# Patient Record
Sex: Female | Born: 1960
Health system: Southern US, Community
[De-identification: ages and names within clinical notes are randomized; demographics above are authoritative.]

## PROBLEM LIST (undated history)

## (undated) DIAGNOSIS — Z789 Other specified health status: Secondary | ICD-10-CM

## (undated) HISTORY — DX: Other specified health status: Z78.9

## (undated) HISTORY — PX: WISDOM TOOTH EXTRACTION: SHX21

---

## 2017-02-24 ENCOUNTER — Encounter: Payer: Self-pay | Admitting: Sports Medicine

## 2017-02-24 ENCOUNTER — Ambulatory Visit (INDEPENDENT_AMBULATORY_CARE_PROVIDER_SITE_OTHER): Payer: 59 | Admitting: Sports Medicine

## 2017-02-24 ENCOUNTER — Ambulatory Visit: Payer: Self-pay

## 2017-02-24 VITALS — BP 125/82 | HR 74 | Ht 65.0 in | Wt 133.0 lb

## 2017-02-24 DIAGNOSIS — M25562 Pain in left knee: Secondary | ICD-10-CM | POA: Insufficient documentation

## 2017-02-24 HISTORY — DX: Pain in left knee: M25.562

## 2017-02-24 NOTE — Progress Notes (Signed)
  HESPER VENTURELLA - 56 y.o. female MRN [id number]  Date of birth: 1961-09-16  SUBJECTIVE:  Including CC & ROS.   Ms. Kittler is a 56 yo F that is presenting with left knee pain. The pain has been occurring since January when she is working out in her yard. It started out as a soreness and did not go away. She has taken Advil a day and that seems to have significantly improved her pain. She has pain with certain movements and flexion and extension. She has worn a compression brace and that has helped. She denies any giving way. She has not had any formal physical therapy or prior x-rays. She denies any prior injury before this or any swelling.  HISTORY: Past Medical, Surgical, Social, and Family History Reviewed & Updated per EMR.   Pertinent Historical Findings include: PMSHx -  None  PSHx -  No tobacco or alcohol use  FHx -  Cancer   DATA REVIEWED: None   PHYSICAL EXAM:  VS: BP:125/82  HR:74bpm  TEMP: ( )  RESP:   HT:5\' 5"  (165.1 cm)   WT:133 lb (60.3 kg)  BMI:22.2 PHYSICAL EXAM: Gen: NAD, alert, cooperative with exam, well-appearing HEENT: clear conjunctiva, EOMI CV:  no edema, capillary refill brisk,  Resp: non-labored, normal speech Skin: no rashes, normal turgor  Neuro: no gross deficits.  Psych:  alert and oriented Knee: Normal to inspection with no erythema or effusion or obvious bony abnormalities. Palpation normal with no warmth,patellar tenderness, or condyle tenderness. Some medial joint line tenderness. Has some slight catching when moved from flexion to extension ROM full in flexion and extension and lower leg rotation. Ligaments with solid consistent endpoints including  LCL, MCL. Some pain with Thessalonian tests. Non painful patellar compression. Patellar glide without crepitus. Patellar and quadriceps tendons unremarkable. Hamstring and quadriceps strength is normal.    Limited ultrasound: Left knee:  Mild effusion observed in the SPP  QT and PT were  viewed in long axis and found to be normal.  The lateral meniscus had some outpouching to suggest degenerative changes.  The medial meniscus had some outpouching to suggest degenerative changes.  The trochler groove showed some spurring in the medial and lateral femoral condyle.   Summary: Findings are consistent with degenerative meniscal changes and a mild effusion.  Ultrasound and interpretation by Clare Gandy, MD   ASSESSMENT & PLAN:   Acute pain of left knee She likely has symptoms associated with contusion to her meniscus and evidence of degenerative changes on ultrasound. - Continue compression sleeve - Provided home exercises - Follow-up in 2 months

## 2017-02-24 NOTE — Patient Instructions (Signed)
Thank you for coming in,   Mini squat with both legs to about 30 degrees   Mini lunge. Take a shorter step if it hurts.   Try one leg wall slides.   One leg mini squats.     Please feel free to call with any questions or concerns at any time, at [phone]. --Dr. Jordan Likes

## 2017-02-24 NOTE — Assessment & Plan Note (Signed)
She likely has symptoms associated with contusion to her meniscus and evidence of degenerative changes on ultrasound. - Continue compression sleeve - Provided home exercises - Follow-up in 2 months

## 2017-03-03 ENCOUNTER — Encounter: Payer: Self-pay | Admitting: *Deleted

## 2017-03-03 ENCOUNTER — Telehealth: Payer: Self-pay | Admitting: *Deleted

## 2017-03-03 DIAGNOSIS — M25562 Pain in left knee: Secondary | ICD-10-CM

## 2017-03-03 NOTE — Telephone Encounter (Signed)
Patient states she can only get and MRI (if that is our next step) at [facility] due to her insurance.  Per Fields, the plan for now is to get images and based on those we will probably move toward injections before the MRI option

## 2017-03-05 ENCOUNTER — Ambulatory Visit: Payer: Self-pay | Admitting: Sports Medicine

## 2017-03-12 ENCOUNTER — Other Ambulatory Visit: Payer: Self-pay | Admitting: Orthopedic Surgery

## 2017-03-12 DIAGNOSIS — M25562 Pain in left knee: Secondary | ICD-10-CM

## 2017-03-12 DIAGNOSIS — R609 Edema, unspecified: Secondary | ICD-10-CM

## 2017-03-13 ENCOUNTER — Ambulatory Visit
Admission: RE | Admit: 2017-03-13 | Discharge: 2017-03-13 | Disposition: A | Discharge status: Home / Self Care | Payer: 59 | Source: Ambulatory Visit | Attending: Orthopedic Surgery | Admitting: Orthopedic Surgery

## 2017-03-13 DIAGNOSIS — M25562 Pain in left knee: Secondary | ICD-10-CM

## 2017-03-13 DIAGNOSIS — R609 Edema, unspecified: Secondary | ICD-10-CM

## 2017-03-19 ENCOUNTER — Encounter: Payer: Self-pay | Admitting: Sports Medicine

## 2017-03-20 ENCOUNTER — Encounter: Payer: Self-pay | Admitting: Sports Medicine

## 2017-03-23 ENCOUNTER — Telehealth: Payer: Self-pay | Admitting: *Deleted

## 2017-03-23 DIAGNOSIS — M25562 Pain in left knee: Secondary | ICD-10-CM

## 2017-03-23 NOTE — Telephone Encounter (Signed)
Order faxed to [facility]. °

## 2017-03-30 ENCOUNTER — Other Ambulatory Visit: Payer: Self-pay | Admitting: *Deleted

## 2017-03-30 MED ORDER — CARISOPRODOL 350 MG PO TABS: 350.0000 mg | ORAL_TABLET | Freq: Three times a day (TID) | ORAL | 0 refills | Status: DC

## 2017-03-31 ENCOUNTER — Encounter: Payer: Self-pay | Admitting: Sports Medicine

## 2017-03-31 ENCOUNTER — Ambulatory Visit (INDEPENDENT_AMBULATORY_CARE_PROVIDER_SITE_OTHER): Payer: 59 | Admitting: Sports Medicine

## 2017-03-31 DIAGNOSIS — M25562 Pain in left knee: Secondary | ICD-10-CM | POA: Diagnosis not present

## 2017-03-31 NOTE — Progress Notes (Signed)
F/U Left knee pain  Knee has been locking since prior Appt.  At that appt she showed hypoechoic change of meniscus and some degenerative meniscal change.  Now hurts along anterior knee  Saw Dr Sherlean FootLucey and had MRI Some meniscal tearing and DJD changes/ small Baker's cyst Also PF cartilage thinning He offered surgical approach but she decided to try PT  After couple days of PT, knee seemed to lock in slt flexion. She also gets a burning pain down to lateral calf Position change will relieve this Difficulty walking when locked  Today she felt pop anteriorly and knee unlocked  Soc Hx - works for chiropractor Having problems working as the knee has been painful  ROS No swelling No warmth or redness  PExam WF in NAD Ht 5' 5.5" (1.664 m)   Wt 133 lb (60.3 kg)   BMI 21.80 kg/m   Left Knee Knee: Normal to inspection with no erythema or effusion or obvious bony abnormalities. Palpation normal with no warmth or joint line tenderness  Some patellar tenderness. ROM normal in flexion and extension and lower leg rotation. Ligaments with solid consistent endpoints including ACL, PCL, LCL, MCL. Negative Mcmurray's and provocative meniscal tests. Tracking laterally with clicking of patella on repeat flexio  painful patellar compression. Patellar and quadriceps tendons unremarkable. Hamstring and quadriceps strength is normal. Baker's cyst is not palpable  MRI reviewed Some signal in meniscus Small  Post medial meniscus tear Small Baker's cyst Some medial condyle bone edema and cartilage loss Thinning of lateral patellar cartilage

## 2017-03-31 NOTE — Assessment & Plan Note (Signed)
I think she had patellar subluxation leaving the knee in flexion Now back in track Some degenerative changes noted on MRI  HEP and progress to biking Patellar subluxation brace given Icing  REck in 4 wees

## 2017-03-31 NOTE — Patient Instructions (Signed)
I think your knee cap is coming out of position and gets locked  Do the 3 exercises on the sheet 10 isometric # sets of 15 of the other two Add ankle weight when you can  Use brace when standing and walking You can take this off periodically Don't sleep in brace  Ice at end of day  I think the nerve irritation will gradually settle down Use ibuprofen to help control

## 2017-04-02 ENCOUNTER — Ambulatory Visit: Payer: 59 | Admitting: Sports Medicine

## 2017-05-05 ENCOUNTER — Ambulatory Visit: Payer: 59 | Admitting: Sports Medicine

## 2017-05-19 ENCOUNTER — Ambulatory Visit: Payer: 59 | Admitting: Sports Medicine

## 2017-06-09 ENCOUNTER — Ambulatory Visit: Payer: 59 | Admitting: Sports Medicine

## 2019-02-02 IMAGING — MR MR KNEE*L* W/O CM
5 of 6 series · 32 of 40 positions shown · non-contrast
Comparison: None.

CLINICAL DATA: Acute left knee pain and swelling especially
posteriorly.

EXAM:
MRI OF THE LEFT KNEE WITHOUT CONTRAST
TECHNIQUE: Multiplanar, multisequence MR imaging of the knee was performed. No
intravenous contrast was administered.

[Series 6: PD fat-sat · axial · left · 3.0mm · 0.39mm/px · z∈[-72,+43]mm · 8 of 36 slices shown (1 of 3)]
[im 1/36]
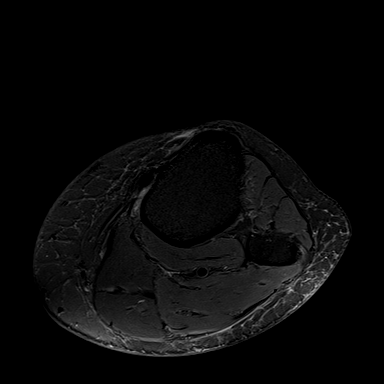
[im 6/36]
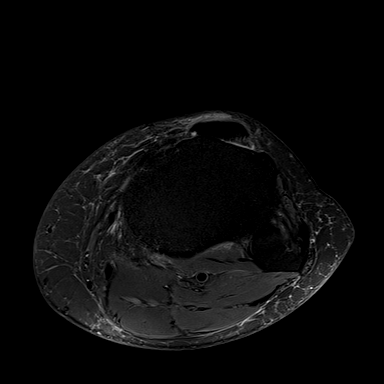
[im 11/36]
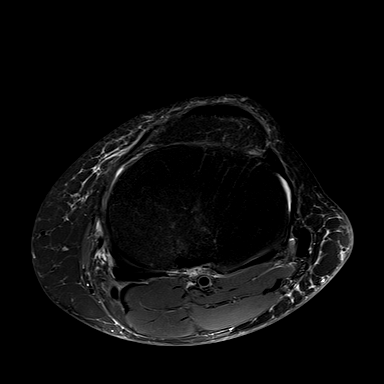
[im 16/36]
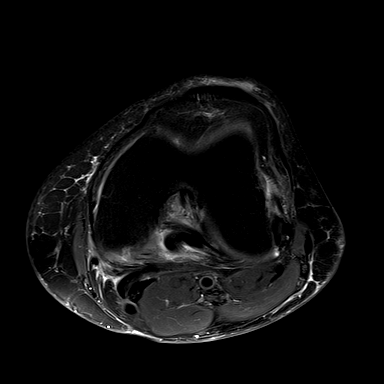
[im 21/36]
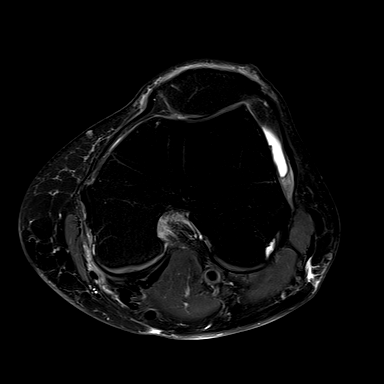
[im 26/36]
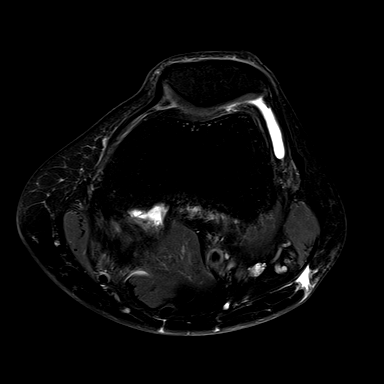
[im 31/36]
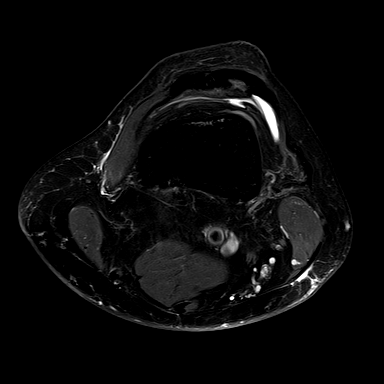
[im 36/36]
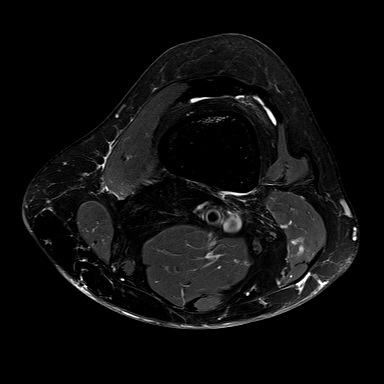

[Series 8: PD fat-sat · sagittal · left · 3.0mm · 0.39mm/px · 6 of 27 slices shown (2 of 3)]
[im 1/27]
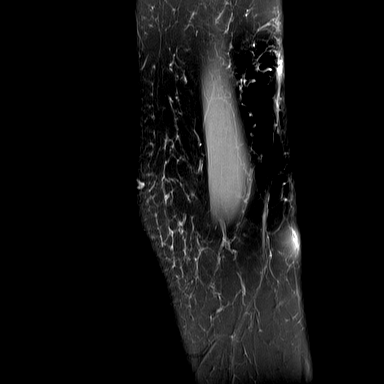
[im 6/27]
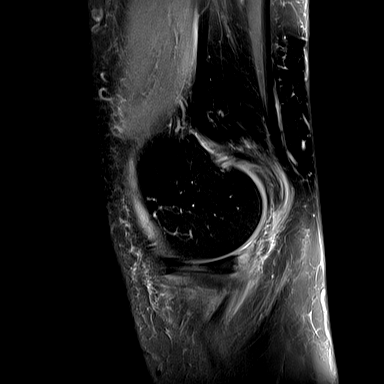
[im 11/27]
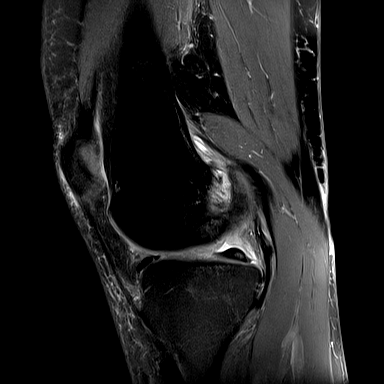
[im 16/27]
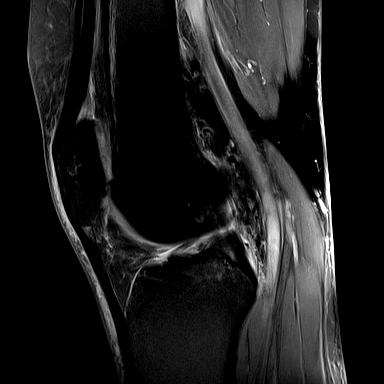
[im 21/27]
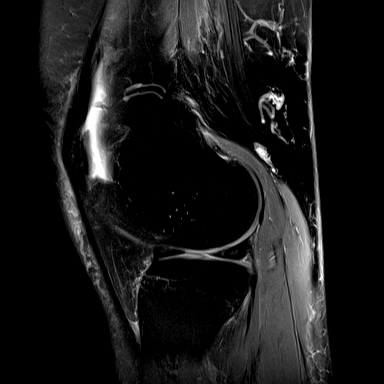
[im 27/27]
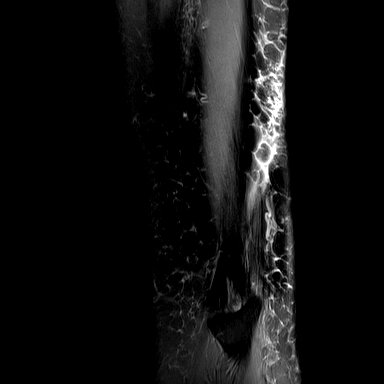

[Series 9: PD fat-sat · coronal · left · 3.0mm · 0.33mm/px · 7 of 33 slices shown (3 of 3)]
[im 1/33]
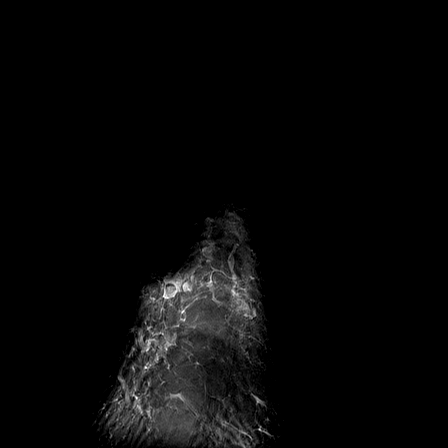
[im 6/33]
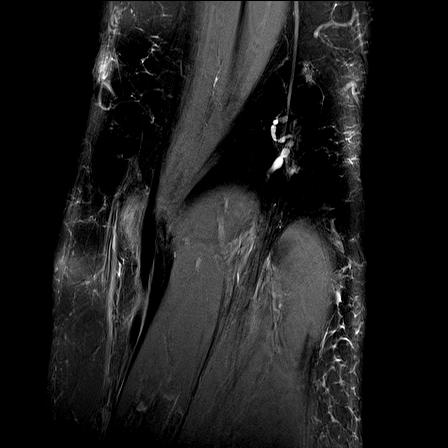
[im 11/33]
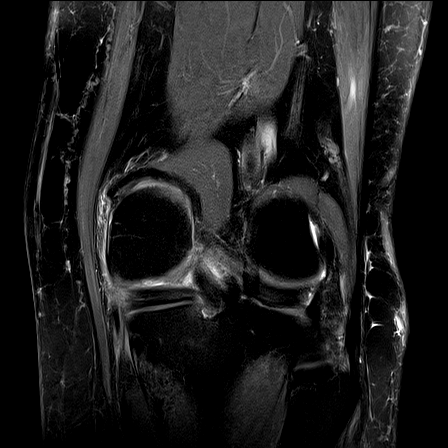
[im 17/33]
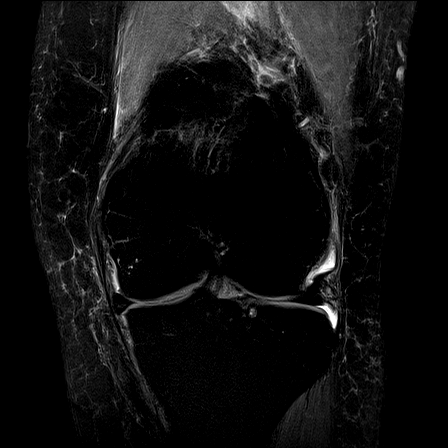
[im 22/33]
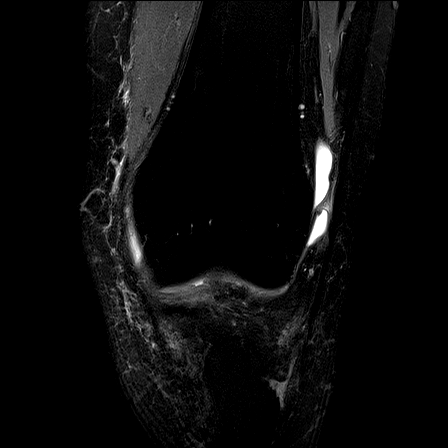
[im 27/33]
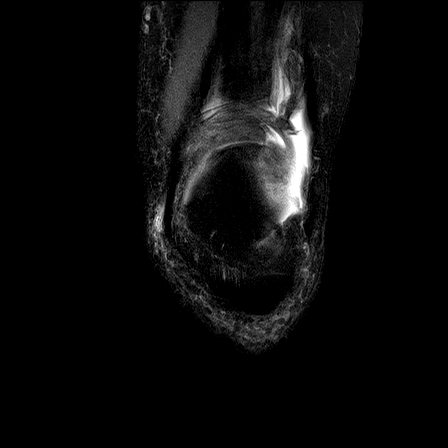
[im 33/33]
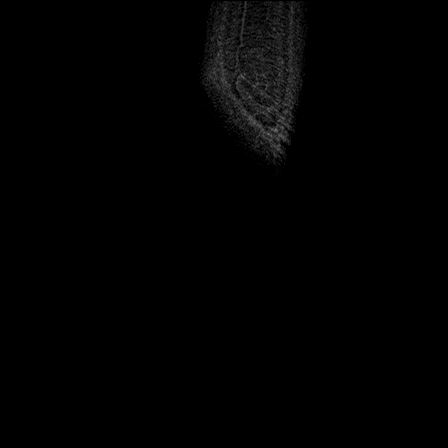

[Series 10: T2 fat-sat · coronal · left · 3.0mm · 0.39mm/px · 6 of 33 slices shown]
[im 1/33]
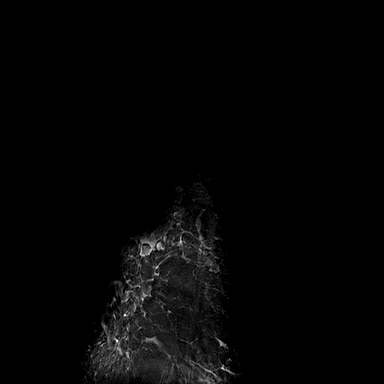
[im 6/33]
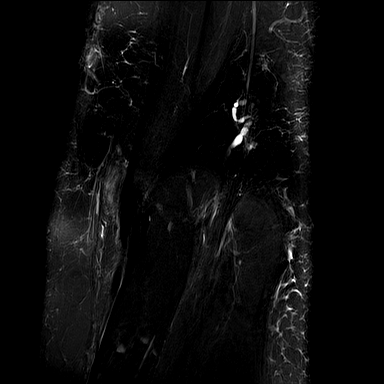
[im 11/33]
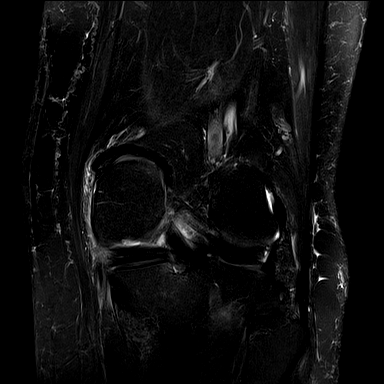
[im 17/33]
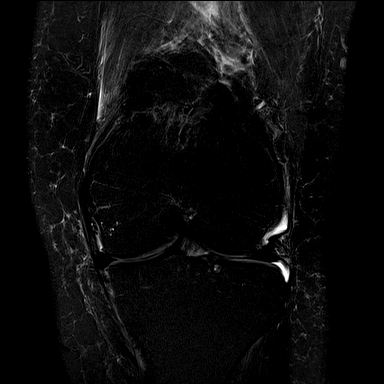
[im 22/33]
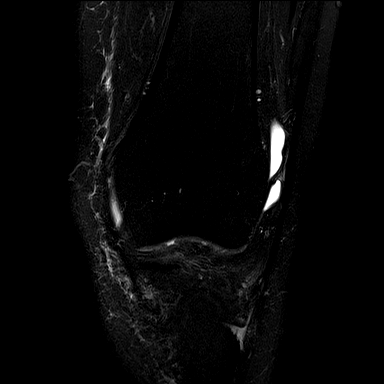
[im 27/33]
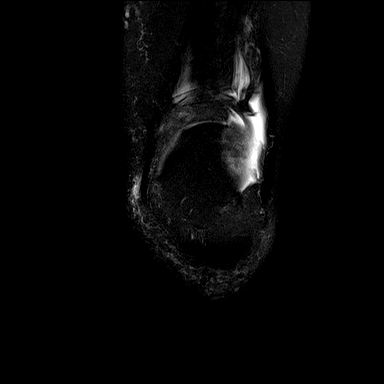

[Series 11: PD · coronal · left · 1.5mm · 0.44mm/px · 5 of 21 slices shown]
[im 1/21]
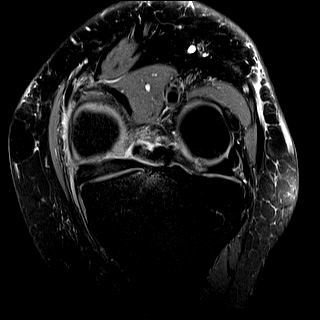
[im 6/21]
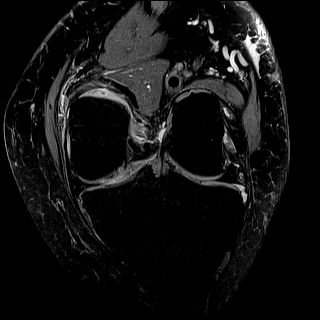
[im 11/21]
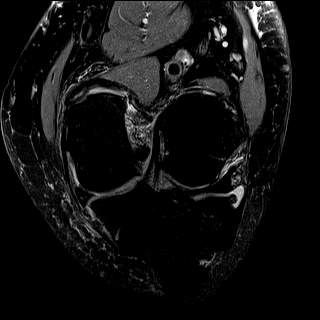
[im 16/21]
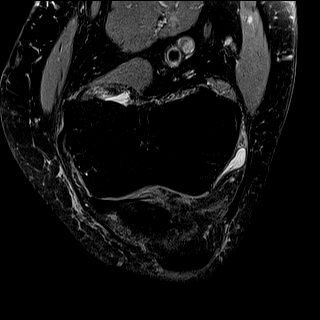
[im 21/21]
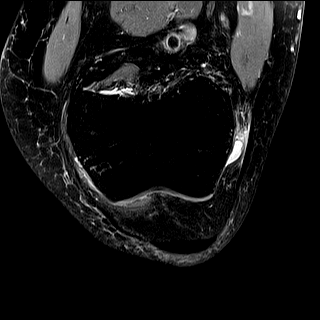

[32 of 40 positions shown; findings below may reference images not displayed]

FINDINGS: MENISCI

Medial meniscus: Grade 1 signal in the posterior horn without
surface extension.

Lateral meniscus: Small free edge tear of the posterior horn on
image [DATE] also shown on image [DATE] and image [DATE]. Grade 1 signal
in the anterior and posterior horn is also present.

LIGAMENTS

Cruciates:  Unremarkable

Collaterals: Mild edema tracks adjacent to the MCL. This can be
incidental but in the appropriate clinical circumstance could
represent grade 1 sprain.

CARTILAGE

Patellofemoral: Mild chondral thinning along the femoral trochlear
groove and portions of the lateral patellar facet.

Medial: Focal chondral defect posteriorly along the medial femoral
condyle approximately 1.0 by 0.9 cm shown on images 10 through 12
series 9, with associated mild chondral surface irregularity in the
medial femoral condyle and underlying mild to moderate degenerative
chondral thinning. Mild marginal spurring.

Lateral: Mild to moderate degenerative chondral thinning with mild
focal sagittally oriented chondral irregularity along the lateral
femoral condyle posteriorly. Mild marginal spurring.

Joint:  Small knee effusion.  Mildly thickened medial plica.

Popliteal Fossa: Small Baker's cyst. Mild semimembranosus - tibial
collateral ligament bursitis and mild pes anserine bursitis.

Extensor Mechanism: Tendinopathy or mild focal partial tearing of
the distal quadriceps tendon laterally, image [DATE].

Bones: No significant extra-articular osseous abnormalities
identified.

Other: No supplemental non-categorized findings.
IMPRESSION: 1. Small free edge tear of the posterior horn lateral meniscus.
2. Tendinopathy or mild focal partial tearing of the distal lateral
quadriceps tendon.
3. Osteoarthritis, with focal chondral defects along the femoral
condyles as detailed above.
4. Mild edema tracks adjacent to the MCL. This can be incidental but
in the appropriate clinical circumstance could represent grade 1
sprain.
5. Posteromedial edema along the knee with small Baker' s cyst, mild
semimembranosus - tibial collateral ligament bursitis, and mild pes
anserine bursitis.
6. Small knee effusion with mildly thickened medial plica.

## 2021-02-27 NOTE — Progress Notes (Signed)
New Patient Office Visit  Subjective:  Patient ID: Julie Pittman, female    DOB: 05-04-1961  Age: 60 y.o. MRN: [id number]  CC:  Chief Complaint  Patient presents with  . Establish Care  . Gastritis    Discuss sucralfate     HPI Julie Pittman presents to establish care.  She is a very pleasant 60 year old female is establishing care with a PCP after many years without consistent medical care.   Notes an area of tenderness in the epigastric area at the base of the sternum that radiates bilaterally over the bottom of her ribs. Reports having this years ago and completing a workup with a GI provider. She trialed multiple medications for GERD and they did not help. Was finally given a prescription for Carafate which helped tremendously and her pain was gone after about 2 weeks. Gets this pain in times of high stress. Over the past couple of weeks, has had an increase in stress and her pain has returned. She has the remnants of the Carafate that she did not need with her but they expired several years ago. Would like to have a refill of this since it helped so much last time.   Leg swelling- notes that if she wears regular socks, she gets swelling around the band of the socks at the end of the day. Has started wearing compression stockings which seem to be helpful but there are some that feel like they cut her at the back of her knee. Not adding salt and tries to avoid high sodium foods.   Past Medical History:  Diagnosis Date  . Acute pain of left knee 02/24/2017   MRI shows degnerative meniscal changes and some focal OA These do not correlate with curretn pain pattern  . Celiac disease 02/28/2021  . No pertinent past medical history   . Postmenopausal atrophic vaginitis 02/28/2021    Past Surgical History:  Procedure Laterality Date  . WISDOM TOOTH EXTRACTION      Family History  Problem Relation Age of Onset  . Heart attack Father   . Lung cancer Father   . Lung cancer Paternal  Aunt   . Lung cancer Maternal Grandfather   . Heart attack Paternal Grandfather   . Rheumatic fever Paternal Grandfather     Social History   Socioeconomic History  . Marital status: Divorced    Spouse name: Not on file  . Number of children: Not on file  . Years of education: Not on file  . Highest education level: Not on file  Occupational History  . Not on file  Tobacco Use  . Smoking status: Former Smoker    Quit date: 1984    Years since quitting: 38.3  . Smokeless tobacco: Never Used  Vaping Use  . Vaping Use: Never used  Substance and Sexual Activity  . Alcohol use: Yes    Comment: occasionally  . Drug use: Never  . Sexual activity: Not Currently    Partners: Male    Birth control/protection: Post-menopausal  Other Topics Concern  . Not on file  Social History Narrative  . Not on file   Social Determinants of Health   Financial Resource Strain: Not on file  Food Insecurity: Not on file  Transportation Needs: Not on file  Physical Activity: Not on file  Stress: Not on file  Social Connections: Not on file  Intimate Partner Violence: Not on file    ROS Review of Systems  Constitutional: Negative for chills,  fatigue, fever and unexpected weight change.  HENT: Negative for congestion, rhinorrhea, sinus pressure and sore throat.   Respiratory: Negative for cough, chest tightness and shortness of breath.   Cardiovascular: Negative for chest pain, palpitations and leg swelling.  Gastrointestinal: Negative for abdominal pain, constipation, diarrhea, nausea and vomiting.  Endocrine: Negative for cold intolerance and heat intolerance.  Genitourinary: Negative for dysuria, frequency, urgency, vaginal bleeding and vaginal discharge.  Skin: Negative for rash and wound.  Neurological: Negative for dizziness, light-headedness and headaches.  Hematological: Does not bruise/bleed easily.  Psychiatric/Behavioral: Negative for self-injury, sleep disturbance and suicidal  ideas. The patient is not nervous/anxious.     Objective:   Today's Vitals: BP 119/78   Pulse 62   Temp 98 F (36.7 C)   Ht 5' 4.5" (1.638 m)   Wt 125 lb 6.4 oz (56.9 kg)   SpO2 100%   BMI 21.19 kg/m   Physical Exam Vitals reviewed.  Constitutional:      General: She is not in acute distress.    Appearance: Normal appearance.  HENT:     Head: Normocephalic and atraumatic.  Eyes:     Extraocular Movements: Extraocular movements intact.     Conjunctiva/sclera: Conjunctivae normal.     Pupils: Pupils are equal, round, and reactive to light.  Cardiovascular:     Rate and Rhythm: Normal rate and regular rhythm.     Pulses: Normal pulses.     Heart sounds: Normal heart sounds. No murmur heard. No friction rub. No gallop.   Pulmonary:     Effort: Pulmonary effort is normal. No respiratory distress.     Breath sounds: Normal breath sounds. No wheezing.  Abdominal:     General: Bowel sounds are normal. There is no distension.     Palpations: Abdomen is soft. There is no mass.     Tenderness: There is no abdominal tenderness. There is no guarding or rebound.     Hernia: No hernia is present.  Musculoskeletal:        General: Normal range of motion.  Skin:    General: Skin is warm and dry.  Neurological:     Mental Status: She is alert and oriented to person, place, and time.  Psychiatric:        Mood and Affect: Mood normal.        Behavior: Behavior normal.        Thought Content: Thought content normal.        Judgment: Judgment normal.     Assessment & Plan:   1. Encounter to establish care Reviewed available information discussed health concerns with patient.  2. Annual physical exam Checking CBC with differential, CMP, and lipid panel today. - CBC with Differential/Platelet - COMPLETE METABOLIC PANEL WITH GFR - Lipid panel  3. Epigastric pain Questionable gastritis versus costochondritis.  Since she does have tenderness in the epigastric area this abdomen  below the sternum, we will go ahead and send in a refill of the Carafate to see if this is helpful.  May benefit from treating with anti-inflammatories for costochondritis if the Carafate does not help.   4. Leg swelling Continue compression stockings/socks. Stay well hydrated and avoid excess sodium in the diet. Suspect this is simply venous insufficiency but no need for aggressive intervention since the swelling is mild and no other symptoms present.   5. Encounter for screening mammogram for malignant neoplasm of breast Mammogram ordered.  - MM DIGITAL SCREENING BILATERAL; Future  6. Screening for endocrine  disorder Checking TSH.  - TSH  7. Screening for colon cancer Since she cannot tolerate the volume of liquid that is required of most colonoscopy preps, she has not had this done. Is open to the procedure if there is a prep that doesn't require large amounts of fluid consumption. Sending a message to Dr. Barron Alvine to see if there are other options that they can use. Will update patient with his reply.  Outpatient Encounter Medications as of 02/28/2021  Medication Sig  . B Complex-C (B-COMPLEX WITH VITAMIN C) tablet Take 1 tablet by mouth daily as needed.  . Magnesium Citrate POWD Take 165 mg by mouth daily as needed.  . Multiple Vitamins-Minerals (MULTIVITAMIN ADULT PO) Take 1 tablet by mouth daily as needed.  . Omega-3 Fatty Acids (FISH OIL) 1000 MG CAPS Take 2 capsules by mouth daily as needed.  . sucralfate (CARAFATE) 1 g tablet Take 1 tablet (1 g total) by mouth 4 (four) times daily.  . [DISCONTINUED] carisoprodol (SOMA) 350 MG tablet Take 1 tablet (350 mg total) by mouth 3 (three) times daily.  . [DISCONTINUED] ibuprofen (ADVIL,MOTRIN) 200 MG tablet Take 200 mg by mouth every 6 (six) hours as needed.   No facility-administered encounter medications on file as of 02/28/2021.   Follow-up: Return in about 1 year (around 02/28/2022) for annual physical exam or sooner if needed.  Further follow up pending lab results.   Thayer Ohm, DNP, APRN, FNP-BC [facility]

## 2021-02-28 ENCOUNTER — Other Ambulatory Visit: Payer: Self-pay

## 2021-02-28 ENCOUNTER — Ambulatory Visit (INDEPENDENT_AMBULATORY_CARE_PROVIDER_SITE_OTHER): Payer: BC Managed Care – PPO | Admitting: Medical-Surgical

## 2021-02-28 ENCOUNTER — Encounter: Payer: Self-pay | Admitting: Medical-Surgical

## 2021-02-28 VITALS — BP 119/78 | HR 62 | Temp 98.0°F | Ht 64.5 in | Wt 125.4 lb

## 2021-02-28 DIAGNOSIS — Z0000 Encounter for general adult medical examination without abnormal findings: Secondary | ICD-10-CM

## 2021-02-28 DIAGNOSIS — Z7689 Persons encountering health services in other specified circumstances: Secondary | ICD-10-CM | POA: Diagnosis not present

## 2021-02-28 DIAGNOSIS — Z1211 Encounter for screening for malignant neoplasm of colon: Secondary | ICD-10-CM

## 2021-02-28 DIAGNOSIS — M7989 Other specified soft tissue disorders: Secondary | ICD-10-CM | POA: Diagnosis not present

## 2021-02-28 DIAGNOSIS — N952 Postmenopausal atrophic vaginitis: Secondary | ICD-10-CM

## 2021-02-28 DIAGNOSIS — R1013 Epigastric pain: Secondary | ICD-10-CM | POA: Diagnosis not present

## 2021-02-28 DIAGNOSIS — K900 Celiac disease: Secondary | ICD-10-CM

## 2021-02-28 DIAGNOSIS — Z1329 Encounter for screening for other suspected endocrine disorder: Secondary | ICD-10-CM

## 2021-02-28 DIAGNOSIS — E785 Hyperlipidemia, unspecified: Secondary | ICD-10-CM | POA: Diagnosis not present

## 2021-02-28 DIAGNOSIS — Z1231 Encounter for screening mammogram for malignant neoplasm of breast: Secondary | ICD-10-CM

## 2021-02-28 HISTORY — DX: Celiac disease: K90.0

## 2021-02-28 HISTORY — DX: Postmenopausal atrophic vaginitis: N95.2

## 2021-02-28 MED ORDER — SUCRALFATE 1 G PO TABS: 1.0000 g | ORAL_TABLET | Freq: Four times a day (QID) | ORAL | 0 refills | Status: AC

## 2021-03-01 LAB — COMPLETE METABOLIC PANEL WITH GFR
AG Ratio: 1.8 (calc) (ref 1.0–2.5)
ALT: 17 U/L (ref 6–29)
AST: 21 U/L (ref 10–35)
Albumin: 4.6 g/dL (ref 3.6–5.1)
Alkaline phosphatase (APISO): 67 U/L (ref 37–153)
BUN: 11 mg/dL (ref 7–25)
CO2: 28 mmol/L (ref 20–32)
Calcium: 9.5 mg/dL (ref 8.6–10.4)
Chloride: 104 mmol/L (ref 98–110)
Creat: 0.71 mg/dL (ref 0.50–0.99)
GFR, Est African American: 107 mL/min/{1.73_m2} (ref >=60)
GFR, Est Non African American: 92 mL/min/{1.73_m2} (ref >=60)
Globulin: 2.5 g/dL (calc) (ref 1.9–3.7)
Glucose, Bld: 88 mg/dL (ref 65–99)
Potassium: 4 mmol/L (ref 3.5–5.3)
Sodium: 140 mmol/L (ref 135–146)
Total Bilirubin: 0.7 mg/dL (ref 0.2–1.2)
Total Protein: 7.1 g/dL (ref 6.1–8.1)

## 2021-03-01 LAB — CBC WITH DIFFERENTIAL/PLATELET
Absolute Monocytes: 398 cells/uL (ref 200–950)
Basophils Absolute: 62 cells/uL (ref 0–200)
Basophils Relative: 1.1 %
Eosinophils Absolute: 62 cells/uL (ref 15–500)
Eosinophils Relative: 1.1 %
HCT: 43.3 % (ref 35.0–45.0)
Hemoglobin: 14.3 g/dL (ref 11.7–15.5)
Lymphs Abs: 1232 cells/uL (ref 850–3900)
MCH: 31.8 pg (ref 27.0–33.0)
MCHC: 33 g/dL (ref 32.0–36.0)
MCV: 96.2 fL (ref 80.0–100.0)
MPV: 11.2 fL (ref 7.5–12.5)
Monocytes Relative: 7.1 %
Neutro Abs: 3847 cells/uL (ref 1500–7800)
Neutrophils Relative %: 68.7 %
Platelets: 235 10*3/uL (ref 140–400)
RBC: 4.5 10*6/uL (ref 3.80–5.10)
RDW: 11.7 % (ref 11.0–15.0)
Total Lymphocyte: 22 %
WBC: 5.6 10*3/uL (ref 3.8–10.8)

## 2021-03-01 LAB — LIPID PANEL
Cholesterol: 240 mg/dL — ABNORMAL HIGH (ref <200)
HDL: 77 mg/dL (ref >=50)
LDL Cholesterol (Calc): 149 mg/dL (calc) — ABNORMAL HIGH
Non-HDL Cholesterol (Calc): 163 mg/dL (calc) — ABNORMAL HIGH (ref <130)
Total CHOL/HDL Ratio: 3.1 (calc) (ref <5.0)
Triglycerides: 52 mg/dL (ref <150)

## 2021-03-01 LAB — TSH: TSH: 1.93 mIU/L (ref 0.40–4.50)

## 2021-03-06 ENCOUNTER — Encounter: Payer: Self-pay | Admitting: Medical-Surgical

## 2021-03-07 ENCOUNTER — Ambulatory Visit: Payer: 59 | Admitting: Medical-Surgical

## 2021-07-08 DIAGNOSIS — Z1231 Encounter for screening mammogram for malignant neoplasm of breast: Secondary | ICD-10-CM | POA: Diagnosis not present

## 2021-07-12 ENCOUNTER — Encounter: Payer: Self-pay | Admitting: Medical-Surgical

## 2021-08-13 ENCOUNTER — Encounter: Payer: Self-pay | Admitting: Medical-Surgical

## 2021-08-30 DIAGNOSIS — L578 Other skin changes due to chronic exposure to nonionizing radiation: Secondary | ICD-10-CM | POA: Diagnosis not present

## 2021-08-30 DIAGNOSIS — D225 Melanocytic nevi of trunk: Secondary | ICD-10-CM | POA: Diagnosis not present

## 2021-08-30 DIAGNOSIS — L719 Rosacea, unspecified: Secondary | ICD-10-CM | POA: Diagnosis not present

## 2021-08-30 DIAGNOSIS — L299 Pruritus, unspecified: Secondary | ICD-10-CM | POA: Diagnosis not present

## 2021-09-26 DIAGNOSIS — Z1151 Encounter for screening for human papillomavirus (HPV): Secondary | ICD-10-CM | POA: Diagnosis not present

## 2021-09-26 DIAGNOSIS — Z01419 Encounter for gynecological examination (general) (routine) without abnormal findings: Secondary | ICD-10-CM | POA: Diagnosis not present

## 2021-09-26 DIAGNOSIS — N952 Postmenopausal atrophic vaginitis: Secondary | ICD-10-CM | POA: Diagnosis not present

## 2022-03-06 ENCOUNTER — Encounter: Payer: 59 | Admitting: Medical-Surgical

## 2022-08-29 DIAGNOSIS — L820 Inflamed seborrheic keratosis: Secondary | ICD-10-CM | POA: Diagnosis not present

## 2022-08-29 DIAGNOSIS — L728 Other follicular cysts of the skin and subcutaneous tissue: Secondary | ICD-10-CM | POA: Diagnosis not present

## 2022-09-29 DIAGNOSIS — M25561 Pain in right knee: Secondary | ICD-10-CM | POA: Diagnosis not present

## 2022-09-29 DIAGNOSIS — G8929 Other chronic pain: Secondary | ICD-10-CM | POA: Diagnosis not present

## 2022-10-24 DIAGNOSIS — S76111A Strain of right quadriceps muscle, fascia and tendon, initial encounter: Secondary | ICD-10-CM | POA: Diagnosis not present

## 2022-10-24 DIAGNOSIS — S83241A Other tear of medial meniscus, current injury, right knee, initial encounter: Secondary | ICD-10-CM | POA: Diagnosis not present

## 2022-10-24 DIAGNOSIS — M1711 Unilateral primary osteoarthritis, right knee: Secondary | ICD-10-CM | POA: Diagnosis not present

## 2022-10-24 DIAGNOSIS — S83271A Complex tear of lateral meniscus, current injury, right knee, initial encounter: Secondary | ICD-10-CM | POA: Diagnosis not present

## 2022-10-30 DIAGNOSIS — M23300 Other meniscus derangements, unspecified lateral meniscus, right knee: Secondary | ICD-10-CM | POA: Diagnosis not present

## 2022-10-30 DIAGNOSIS — M23203 Derangement of unspecified medial meniscus due to old tear or injury, right knee: Secondary | ICD-10-CM | POA: Diagnosis not present

## 2022-10-30 DIAGNOSIS — M1711 Unilateral primary osteoarthritis, right knee: Secondary | ICD-10-CM | POA: Diagnosis not present
